# Patient Record
Sex: Female | Born: 1990 | Race: Black or African American | Hispanic: No | Marital: Single | Smoking: Never smoker
Health system: Southern US, Community
[De-identification: ages and names within clinical notes are randomized; demographics above are authoritative.]

## PROBLEM LIST (undated history)

## (undated) DIAGNOSIS — F209 Schizophrenia, unspecified: Secondary | ICD-10-CM

---

## 2016-10-24 DIAGNOSIS — F209 Schizophrenia, unspecified: Secondary | ICD-10-CM

## 2016-10-24 HISTORY — DX: Schizophrenia, unspecified: F20.9

## 2018-12-14 ENCOUNTER — Other Ambulatory Visit: Payer: Self-pay

## 2018-12-14 ENCOUNTER — Emergency Department (HOSPITAL_COMMUNITY)
Admission: EM | Admit: 2018-12-14 | Discharge: 2018-12-14 | Disposition: A | Discharge status: Home / Self Care | Payer: Medicaid - Out of State | Attending: Emergency Medicine | Admitting: Emergency Medicine

## 2018-12-14 ENCOUNTER — Encounter (HOSPITAL_COMMUNITY): Payer: Self-pay

## 2018-12-14 DIAGNOSIS — F209 Schizophrenia, unspecified: Secondary | ICD-10-CM | POA: Diagnosis not present

## 2018-12-14 DIAGNOSIS — X58XXXA Exposure to other specified factors, initial encounter: Secondary | ICD-10-CM | POA: Diagnosis not present

## 2018-12-14 DIAGNOSIS — R600 Localized edema: Secondary | ICD-10-CM

## 2018-12-14 DIAGNOSIS — Y939 Activity, unspecified: Secondary | ICD-10-CM | POA: Diagnosis not present

## 2018-12-14 DIAGNOSIS — Y999 Unspecified external cause status: Secondary | ICD-10-CM | POA: Insufficient documentation

## 2018-12-14 DIAGNOSIS — Y929 Unspecified place or not applicable: Secondary | ICD-10-CM | POA: Insufficient documentation

## 2018-12-14 DIAGNOSIS — T3390XA Superficial frostbite of unspecified sites, initial encounter: Secondary | ICD-10-CM

## 2018-12-14 DIAGNOSIS — M7989 Other specified soft tissue disorders: Secondary | ICD-10-CM | POA: Diagnosis not present

## 2018-12-14 DIAGNOSIS — T6594XA Toxic effect of unspecified substance, undetermined, initial encounter: Secondary | ICD-10-CM | POA: Insufficient documentation

## 2018-12-14 DIAGNOSIS — T24512A Corrosion of first degree of left thigh, initial encounter: Secondary | ICD-10-CM | POA: Diagnosis present

## 2018-12-14 HISTORY — DX: Schizophrenia, unspecified: F20.9

## 2018-12-14 LAB — COMPREHENSIVE METABOLIC PANEL
ALT: 51 U/L — ABNORMAL HIGH (ref 0–44)
AST: 76 U/L — ABNORMAL HIGH (ref 15–41)
Albumin: 3.9 g/dL (ref 3.5–5.0)
Alkaline Phosphatase: 57 U/L (ref 38–126)
Anion gap: 10 (ref 5–15)
BUN: 6 mg/dL (ref 6–20)
CHLORIDE: 106 mmol/L (ref 98–111)
CO2: 21 mmol/L — AB (ref 22–32)
Calcium: 8.9 mg/dL (ref 8.9–10.3)
Creatinine, Ser: 0.61 mg/dL (ref 0.44–1.00)
GFR calc Af Amer: >60 mL/min (ref >60)
GFR calc non Af Amer: >60 mL/min (ref >60)
Glucose, Bld: 94 mg/dL (ref 70–99)
Potassium: 3.6 mmol/L (ref 3.5–5.1)
Sodium: 137 mmol/L (ref 135–145)
Total Bilirubin: 0.6 mg/dL (ref 0.3–1.2)
Total Protein: 7.1 g/dL (ref 6.5–8.1)

## 2018-12-14 LAB — I-STAT BETA HCG BLOOD, ED (MC, WL, AP ONLY)

## 2018-12-14 LAB — CBC WITH DIFFERENTIAL/PLATELET
ABS IMMATURE GRANULOCYTES: 0.03 10*3/uL (ref 0.00–0.07)
Basophils Absolute: 0.1 10*3/uL (ref 0.0–0.1)
Basophils Relative: 1 %
Eosinophils Absolute: 0.2 10*3/uL (ref 0.0–0.5)
Eosinophils Relative: 2 %
HCT: 41.1 % (ref 36.0–46.0)
HEMOGLOBIN: 12.9 g/dL (ref 12.0–15.0)
IMMATURE GRANULOCYTES: 0 %
Lymphocytes Relative: 29 %
Lymphs Abs: 2.2 10*3/uL (ref 0.7–4.0)
MCH: 27.9 pg (ref 26.0–34.0)
MCHC: 31.4 g/dL (ref 30.0–36.0)
MCV: 89 fL (ref 80.0–100.0)
Monocytes Absolute: 0.6 10*3/uL (ref 0.1–1.0)
Monocytes Relative: 8 %
NEUTROS ABS: 4.4 10*3/uL (ref 1.7–7.7)
Neutrophils Relative %: 60 %
Platelets: 373 10*3/uL (ref 150–400)
RBC: 4.62 MIL/uL (ref 3.87–5.11)
RDW: 13.6 % (ref 11.5–15.5)
WBC: 7.4 10*3/uL (ref 4.0–10.5)
nRBC: 0 % (ref 0.0–0.2)

## 2018-12-14 LAB — PROTIME-INR
INR: 0.93
Prothrombin Time: 12.4 seconds (ref 11.4–15.2)

## 2018-12-14 LAB — ETHANOL: Alcohol, Ethyl (B): <10 mg/dL (ref <10)

## 2018-12-14 LAB — SEDIMENTATION RATE: Sed Rate: 26 mm/hr — ABNORMAL HIGH (ref 0–22)

## 2018-12-14 LAB — SALICYLATE LEVEL: Salicylate Lvl: <7 mg/dL (ref 2.8–30.0)

## 2018-12-14 LAB — TSH: TSH: 1.141 u[IU]/mL (ref 0.350–4.500)

## 2018-12-14 MED ORDER — BACITRACIN ZINC 500 UNIT/GM EX OINT: 1.0000 "application " | TOPICAL_OINTMENT | Freq: Two times a day (BID) | CUTANEOUS | 0 refills | Status: DC

## 2018-12-14 MED ORDER — IBUPROFEN 600 MG PO TABS: 600.0000 mg | ORAL_TABLET | Freq: Four times a day (QID) | ORAL | 0 refills | Status: DC | PRN

## 2018-12-14 MED ORDER — ACETAMINOPHEN 500 MG PO TABS
1000.0000 mg | ORAL_TABLET | Freq: Once | ORAL | Status: AC
  Administered 2018-12-14: 1000 mg via ORAL
  Filled 2018-12-14: qty 2

## 2018-12-14 NOTE — ED Notes (Signed)
Pt aware of need for urine sample.  

## 2018-12-14 NOTE — ED Notes (Addendum)
Pt refused to allow phlebotomy to draw blood. Will allow blood to be drawn, just not by that phlebotomist (there was no unprofessional behavior on the part of the phlebotomist, this was a personal request by the patient)
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
## 2018-12-14 NOTE — ED Notes (Signed)
Pt removed monitors, resting comfortably NAD, will spot check vitals.
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
## 2018-12-18 ENCOUNTER — Encounter: Payer: Self-pay | Admitting: Emergency Medicine

## 2018-12-18 ENCOUNTER — Emergency Department (HOSPITAL_COMMUNITY)
Admission: EM | Admit: 2018-12-18 | Discharge: 2018-12-18 | Disposition: A | Discharge status: Home / Self Care | Payer: Medicaid - Out of State | Attending: Emergency Medicine | Admitting: Emergency Medicine

## 2018-12-18 DIAGNOSIS — S90425A Blister (nonthermal), left lesser toe(s), initial encounter: Secondary | ICD-10-CM | POA: Insufficient documentation

## 2018-12-18 DIAGNOSIS — X31XXXA Exposure to excessive natural cold, initial encounter: Secondary | ICD-10-CM | POA: Insufficient documentation

## 2018-12-18 DIAGNOSIS — T304 Corrosion of unspecified body region, unspecified degree: Secondary | ICD-10-CM

## 2018-12-18 DIAGNOSIS — T6594XA Toxic effect of unspecified substance, undetermined, initial encounter: Secondary | ICD-10-CM | POA: Diagnosis not present

## 2018-12-18 DIAGNOSIS — T24412A Corrosion of unspecified degree of left thigh, initial encounter: Secondary | ICD-10-CM | POA: Insufficient documentation

## 2018-12-18 DIAGNOSIS — M79676 Pain in unspecified toe(s): Secondary | ICD-10-CM

## 2018-12-18 DIAGNOSIS — Y999 Unspecified external cause status: Secondary | ICD-10-CM | POA: Insufficient documentation

## 2018-12-18 DIAGNOSIS — Y939 Activity, unspecified: Secondary | ICD-10-CM | POA: Insufficient documentation

## 2018-12-18 DIAGNOSIS — Y929 Unspecified place or not applicable: Secondary | ICD-10-CM | POA: Insufficient documentation

## 2018-12-18 NOTE — Discharge Instructions (Signed)
Please follow up with your primary care provider within 5-7 days for re-evaluation of your symptoms. If you do not have a primary care provider, information for a healthcare clinic has been provided for you to make arrangements for follow up care. Please return to the emergency department for any new or worsening symptoms. ° °

## 2018-12-18 NOTE — ED Notes (Signed)
Declined W/C at D/C and was escorted to lobby by RN. 
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
## 2018-12-18 NOTE — ED Triage Notes (Addendum)
Pt presents for evaluation of tingling to hands and feet. States does not sleep well. States was here on 2/21 but was not properly discharged. States she did receive prescriptions and was told to come back. Reports condition has gone on for 2 years. Refused to elaborate on condition.

## 2018-12-18 NOTE — ED Notes (Signed)
Pt ambulatory to room with no reported issues. 

## 2018-12-27 ENCOUNTER — Ambulatory Visit (HOSPITAL_COMMUNITY)
Admission: RE | Admit: 2018-12-27 | Discharge: 2018-12-27 | Disposition: A | Discharge status: Home / Self Care | Payer: Self-pay | Attending: Psychiatry | Admitting: Psychiatry

## 2018-12-27 DIAGNOSIS — Z79899 Other long term (current) drug therapy: Secondary | ICD-10-CM | POA: Insufficient documentation

## 2018-12-27 DIAGNOSIS — Z590 Homelessness: Secondary | ICD-10-CM | POA: Insufficient documentation

## 2018-12-27 DIAGNOSIS — F329 Major depressive disorder, single episode, unspecified: Secondary | ICD-10-CM | POA: Insufficient documentation

## 2018-12-27 DIAGNOSIS — R454 Irritability and anger: Secondary | ICD-10-CM | POA: Insufficient documentation

## 2018-12-27 NOTE — BH Assessment (Signed)
Assessment Note  Virginia Buck is an 28 y.o. female.  -Patient is brought to [facility] via car by her maternal aunt.  Patient gives permission for aunt to be present during assessment.  Virginia Buck is Virginia Buck [phone].  When asked why she had come to [facility], patient says "she made me come."  Aunt said that patient has been staying with her since Monday (03/02).  Patient is currently homelss.  She has been in [place] for the past 2-3 weeks.  Patient has been living out of her car during that time.  On Monday patient reached out to her aunt about not having her car.  Patient does not know where it is or if she does, she is not telling.    Patient acts like she is not sure of what happened to her car. Aunt said that patient's behavior has been different lately.  She has been more irritable, withdrawn, talking back to grandparent, etc.  Patient shakes her head and said "no."    Patient denies any SI, HI or visual hallucinations.  She does say she hears voices "off and on" and that she cannot make out what is being said.  No command hallucinations.  Patient denies use of ETOH or illicit drugs.  Patient had a flat affect.  She denies depressive symptoms.  Patient says her anxiety is moderate.  She does say she is taking her prolixin as directed.    Pt has been to [facility] on IVC before, once in November '19 and November '18.  She has no current provider.  -Clinician discussed patient care with Donell Sievert, PA.  He said she did not meet inpatient care criteria.  Patient was given outpatient resources to follow up on.  Patient left [facility] with maternal aunt.   Diagnosis: F25.0 Schizoaffective d/o  Past Medical History:  Past Medical History:  Diagnosis Date  . Schizophrenia (HCC) 2018    No past surgical history on file.  Family History: No family history on file.  Social History:  has no history on file for tobacco, alcohol, and drug.  Additional Social History:  Alcohol / Drug  Use Pain Medications: None Prescriptions: Prolixin  Over the Counter: N/A History of alcohol / drug use?: No history of alcohol / drug abuse(Pt denies)  CIWA:   COWS:    Allergies: No Known Allergies  Home Medications: (Not in a hospital admission)   OB/GYN Status:  No LMP recorded. (Menstrual status: Irregular Periods).  General Assessment Data Location of Assessment: [facility] Assessment Services TTS Assessment: In system Is this a Tele or Face-to-Face Assessment?: Face-to-Face Is this an Initial Assessment or a Re-assessment for this encounter?: Initial Assessment Patient Accompanied by:: Adult(Virginia Buck, maternal aunt [phone]) Permission Given to speak with another: Yes Name, Relationship and Phone Number: Virginia Buck, maternal aunt [phone] Language Other than English: No Living Arrangements: Other (Comment)(Staying w/ maternal aunt) What gender do you identify as?: Female Marital status: Single Pregnancy Status: No Living Arrangements: Other relatives(with maternal aunt) Can pt return to current living arrangement?: Yes Admission Status: Voluntary Is patient capable of signing voluntary admission?: Yes Referral Source: Self/Family/Friend Insurance type: self pay  Medical Screening Exam [facility] Walk-in ONLY) Medical Exam completed: Yes(Spencer Simon, PA)  Crisis Care Plan Living Arrangements: Other relatives(with maternal aunt) Name of Psychiatrist: None Name of Therapist: None  Education Status Is patient currently in school?: No Is the patient employed, unemployed or receiving disability?: Unemployed  Risk to self with the past  6 months Suicidal Ideation: No Has patient been a risk to self within the past 6 months prior to admission? : No Suicidal Intent: No Has patient had any suicidal intent within the past 6 months prior to admission? : No Is patient at risk for suicide?: No Suicidal Plan?: No Has patient had any suicidal plan within the  past 6 months prior to admission? : No Access to Means: No What has been your use of drugs/alcohol within the last 12 months?: Pt denies Previous Attempts/Gestures: No How many times?: 0 Other Self Harm Risks: Pt denies Triggers for Past Attempts: None known Intentional Self Injurious Behavior: None Family Suicide History: No Recent stressful life event(s): Turmoil (Comment)(Pt homeless) Persecutory voices/beliefs?: No Depression: No Depression Symptoms: (Pt is denying depressive symptoms) Substance abuse history and/or treatment for substance abuse?: No Suicide prevention information given to non-admitted patients: Not applicable  Risk to Others within the past 6 months Homicidal Ideation: No Does patient have any lifetime risk of violence toward others beyond the six months prior to admission? : No Thoughts of Harm to Others: No Current Homicidal Intent: No Current Homicidal Plan: No Access to Homicidal Means: No Identified Victim: No one History of harm to others?: Yes Assessment of Violence: In past 6-12 months Violent Behavior Description: year ago Does patient have access to weapons?: No Criminal Charges Pending?: No Does patient have a court date: No Is patient on probation?: No  Psychosis Hallucinations: Auditory(Hears unintelligible voices) Delusions: None noted  Mental Status Report Appearance/Hygiene: Unremarkable Eye Contact: Fair Motor Activity: Freedom of movement, Unremarkable Speech: Logical/coherent, Soft Level of Consciousness: Alert Mood: Ambivalent Affect: Appropriate to circumstance, Blunted Anxiety Level: Moderate Thought Processes: Coherent, Relevant Judgement: Unimpaired Orientation: Person, Place, Time, Situation Obsessive Compulsive Thoughts/Behaviors: None  Cognitive Functioning Concentration: Decreased Memory: Recent Impaired, Remote Impaired Is patient IDD: No Insight: Poor Impulse Control: Poor Appetite: Good Have you had any  weight changes? : No Change Sleep: No Change Total Hours of Sleep: (Up and down at night) Vegetative Symptoms: None  ADLScreening [facility] Assessment Services) Patient's cognitive ability adequate to safely complete daily activities?: Yes Patient able to express need for assistance with ADLs?: Yes Independently performs ADLs?: Yes (appropriate for developmental age)  Prior Inpatient Therapy Prior Inpatient Therapy: Yes Prior Therapy Dates: Nov '19 & '18 Prior Therapy Facilty/Provider(s): [facility] Reason for Treatment: psychosis  Prior Outpatient Therapy Prior Outpatient Therapy: No Does patient have an ACCT team?: No Does patient have Intensive In-House Services?  : No Does patient have [facility] services? : No Does patient have P4CC services?: No  ADL Screening (condition at time of admission) Patient's cognitive ability adequate to safely complete daily activities?: Yes Is the patient deaf or have difficulty hearing?: No Does the patient have difficulty seeing, even when wearing glasses/contacts?: No Does the patient have difficulty concentrating, remembering, or making decisions?: Yes Patient able to express need for assistance with ADLs?: Yes Does the patient have difficulty dressing or bathing?: No Independently performs ADLs?: Yes (appropriate for developmental age) Does the patient have difficulty walking or climbing stairs?: No Weakness of Legs: None Weakness of Arms/Hands: None       Abuse/Neglect Assessment (Assessment to be complete while patient is alone) Abuse/Neglect Assessment Can Be Completed: Yes Physical Abuse: Denies Verbal Abuse: Denies Sexual Abuse: Denies Exploitation of patient/patient's resources: Denies Self-Neglect: Denies     Merchant navy officer (For Healthcare) Does Patient Have a Medical Advance Directive?: No Would patient like information on creating a medical advance directive?:  No - Patient declined          Disposition:   Disposition Initial Assessment Completed for this Encounter: Yes Disposition of Patient: Discharge Patient refused recommended treatment: No Mode of transportation if patient is discharged/movement?: Car Patient referred to: Other (Comment), Outpatient clinic referral(Pt given referrals)  On Site Evaluation by:   Reviewed with Physician:    Beatriz Stallion Ray 12/27/2018 11:00 PM

## 2018-12-28 NOTE — H&P (Signed)
Behavioral Health Medical Screening Exam  Virginia Buck is an 28 y.o. female wo presents to [facility] with her aunt. The patient is minimally communicative, with angry flat affect, but denies self harm, SI,SA or HI. Reportedly per previous TTS eval, when asked why she had come to [facility], patient says "she made me come."  Aunt said that patient has been staying with her since Monday (03/02).  Patient is currently homelss.  She has been in [place] for the past 2-3 weeks.  Patient has been living out of her car during that time.  On Monday patient reached out to her aunt about not having her car.  Patient does not know where it is or if she does, she is not telling.   Patient acts like she is not sure of what happened to her car. Aunt said that patient's behavior has been different lately.  She has been more irritable, withdrawn, talking back to grandparent, etc.  Patient shakes her head and said "no." Patient denies any SI, HI or visual hallucinations.  She does say she hears voices "off and on" and that she cannot make out what is being said.  No command hallucinations.Patient denies use of ETOH or illicit drugs.Patient had a flat affect.  She denies depressive symptoms.  Patient says her anxiety is moderate.  She does say she is taking her prolixin as directed.   Total Time spent with patient: 15 minutes  Psychiatric Specialty Exam: Physical Exam  Constitutional: She is oriented to person, place, and time. She appears well-developed and well-nourished. No distress.  HENT:  Head: Normocephalic.  Respiratory: Effort normal and breath sounds normal. No respiratory distress.  Neurological: She is alert and oriented to person, place, and time. No cranial nerve deficit.  Skin: Skin is warm and dry. She is not diaphoretic.  Psychiatric: Her speech is normal. Her affect is angry and labile. She is withdrawn. Cognition and memory are impaired. She expresses inappropriate judgment. She expresses no homicidal and  no suicidal ideation. She expresses no suicidal plans and no homicidal plans.    Review of Systems  Constitutional: Negative for chills, diaphoresis, fever, malaise/fatigue and weight loss.  Psychiatric/Behavioral: Positive for depression. Negative for hallucinations, substance abuse and suicidal ideas. The patient is not nervous/anxious and does not have insomnia.     There were no vitals taken for this visit.There is no height or weight on file to calculate BMI.  General Appearance: Disheveled  Eye Contact:  Poor  Speech:  Clear and Coherent  Volume:  Normal  Mood:  Angry  Affect:  Congruent  Thought Process:  Disorganized  Orientation:  Full (Time, Place, and Person)  Thought Content:  Tangential  Suicidal Thoughts:  No  Homicidal Thoughts:  No  Memory:  Immediate;   Fair  Judgement:  Poor  Insight:  Lacking  Psychomotor Activity:  Normal  Concentration: Concentration: Fair  Recall:  Poor  Fund of Knowledge:Poor  Language: Fair  Akathisia:  Negative  Handed:  Right  AIMS (if indicated):     Assets:  Social Support  Sleep:       Musculoskeletal: Strength & Muscle Tone: within normal limits Gait & Station: normal Patient leans: N/A  There were no vitals taken for this visit.  Recommendations:  Based on my evaluation the patient does not appear to have an emergency medical condition.  Kerry Hough, PA-C 12/28/2018, 6:27 AM

## 2019-01-11 ENCOUNTER — Emergency Department (HOSPITAL_COMMUNITY)
Admission: EM | Admit: 2019-01-11 | Discharge: 2019-01-11 | Disposition: A | Discharge status: Home / Self Care | Payer: Medicaid - Out of State

## 2019-01-11 NOTE — ED Triage Notes (Addendum)
RN attempted to start triage. Asked pt one question, she began asking about when the vascular center would open. Pt stated that she didn't want RN or NT to provide care for her anymore. Pt stated "I don't want to fucking be here." Pt began dressing and getting read to leave. Pt escorted off premises by security.

## 2019-01-14 ENCOUNTER — Other Ambulatory Visit: Payer: Self-pay

## 2019-01-14 ENCOUNTER — Emergency Department (HOSPITAL_COMMUNITY): Payer: Medicaid - Out of State

## 2019-01-14 ENCOUNTER — Emergency Department (HOSPITAL_COMMUNITY)
Admission: EM | Admit: 2019-01-14 | Discharge: 2019-01-14 | Disposition: A | Discharge status: Home / Self Care | Payer: Medicaid - Out of State | Attending: Emergency Medicine | Admitting: Emergency Medicine

## 2019-01-14 ENCOUNTER — Encounter (HOSPITAL_COMMUNITY): Payer: Self-pay | Admitting: Emergency Medicine

## 2019-01-14 DIAGNOSIS — F209 Schizophrenia, unspecified: Secondary | ICD-10-CM | POA: Insufficient documentation

## 2019-01-14 DIAGNOSIS — N939 Abnormal uterine and vaginal bleeding, unspecified: Secondary | ICD-10-CM | POA: Diagnosis present

## 2019-01-14 DIAGNOSIS — M25562 Pain in left knee: Secondary | ICD-10-CM | POA: Insufficient documentation

## 2019-01-14 DIAGNOSIS — R6889 Other general symptoms and signs: Secondary | ICD-10-CM

## 2019-01-14 LAB — COMPREHENSIVE METABOLIC PANEL
ALT: 30 U/L (ref 0–44)
AST: 39 U/L (ref 15–41)
Albumin: 3.6 g/dL (ref 3.5–5.0)
Alkaline Phosphatase: 55 U/L (ref 38–126)
Anion gap: 11 (ref 5–15)
BUN: 14 mg/dL (ref 6–20)
CO2: 24 mmol/L (ref 22–32)
Calcium: 9 mg/dL (ref 8.9–10.3)
Chloride: 102 mmol/L (ref 98–111)
Creatinine, Ser: 0.71 mg/dL (ref 0.44–1.00)
GFR calc Af Amer: >60 mL/min (ref >60)
GFR calc non Af Amer: >60 mL/min (ref >60)
Glucose, Bld: 97 mg/dL (ref 70–99)
Potassium: 3.2 mmol/L — ABNORMAL LOW (ref 3.5–5.1)
Sodium: 137 mmol/L (ref 135–145)
TOTAL PROTEIN: 6.6 g/dL (ref 6.5–8.1)
Total Bilirubin: 0.8 mg/dL (ref 0.3–1.2)

## 2019-01-14 LAB — CBC WITH DIFFERENTIAL/PLATELET
Abs Immature Granulocytes: 0.02 10*3/uL (ref 0.00–0.07)
BASOS ABS: 0.1 10*3/uL (ref 0.0–0.1)
Basophils Relative: 1 %
Eosinophils Absolute: 0.1 10*3/uL (ref 0.0–0.5)
Eosinophils Relative: 2 %
HCT: 37.5 % (ref 36.0–46.0)
Hemoglobin: 12.1 g/dL (ref 12.0–15.0)
IMMATURE GRANULOCYTES: 0 %
Lymphocytes Relative: 40 %
Lymphs Abs: 2.1 10*3/uL (ref 0.7–4.0)
MCH: 27.9 pg (ref 26.0–34.0)
MCHC: 32.3 g/dL (ref 30.0–36.0)
MCV: 86.6 fL (ref 80.0–100.0)
Monocytes Absolute: 0.5 10*3/uL (ref 0.1–1.0)
Monocytes Relative: 9 %
NRBC: 0 % (ref 0.0–0.2)
Neutro Abs: 2.5 10*3/uL (ref 1.7–7.7)
Neutrophils Relative %: 48 %
Platelets: 359 10*3/uL (ref 150–400)
RBC: 4.33 MIL/uL (ref 3.87–5.11)
RDW: 13 % (ref 11.5–15.5)
WBC: 5.3 10*3/uL (ref 4.0–10.5)

## 2019-01-14 LAB — I-STAT BETA HCG BLOOD, ED (MC, WL, AP ONLY): I-stat hCG, quantitative: <5 m[IU]/mL (ref <5)

## 2019-01-14 MED ORDER — ACETAMINOPHEN 325 MG PO TABS
650.0000 mg | ORAL_TABLET | Freq: Once | ORAL | Status: AC
  Administered 2019-01-14: 650 mg via ORAL
  Filled 2019-01-14: qty 2

## 2019-01-14 NOTE — ED Notes (Addendum)
Pt reports feet pain x 2 weeks, L knee pain started yesterday, and constant vaginal bleeding that began 2 weeks ago and ended a week ago. Additional complaints of dehydration.  Her feet and knee hurts when she walks.

## 2019-01-14 NOTE — ED Provider Notes (Signed)
[facility] EMERGENCY DEPARTMENT Provider Note   CSN: [id number] Arrival date & time: 01/14/19  1733    History   Chief Complaint Chief Complaint  Patient presents with  . Vaginal Bleeding    HPI Virginia Buck is a 28 y.o. female.     Patient is a 28 year old female with past medical history of schizophrenia who presents the emergency department for multiple complaints.  When I initially entered the room and asked the patient what brought her to the emergency department, she looks at the nurse and says "asks her, I do not feel like repeating myself".  She reports that she has had vaginal bleeding for about 2 weeks.  Upon further questioning she states that it has been off and on and not heavy and that she actually has not bled for at least a week.  She is a very poor historian.  She has a very flat affect.  When I asked her if she is sexually active she states "do not ask me that".  I explained to the patient the importance of the question and she states that she is not sexually active.  She then reports that a few weeks ago she felt like she had a lump in her stomach like she was pregnant.  She denies any vaginal discharge or vaginal pain or nausea or vomiting.  She also reports that she feels dehydrated and that her feet hurt.  Reports that she has had left knee pain off and on.  Atraumatic.  Mostly anterior and aching.  Patient reports that she has been trying to get help but has not been helped by anyone.  It appears that she left without being seen from the emergency department several days ago.  She is currently seeing behavioral health. She denies SI, HI or anxiety at this moment     Past Medical History:  Diagnosis Date  . Schizophrenia (HCC) 2018    There are no active problems to display for this patient.   History reviewed. No pertinent surgical history.   OB History   No obstetric history on file.      Home Medications    Prior to Admission  medications   Medication Sig Start Date End Date Taking? Authorizing Provider  bacitracin ointment Apply 1 application topically 2 (two) times daily. 12/14/18   Arby Barrette, MD  ibuprofen (ADVIL,MOTRIN) 600 MG tablet Take 1 tablet (600 mg total) by mouth every 6 (six) hours as needed. 12/14/18   Arby Barrette, MD    Family History No family history on file.  Social History Social History   Tobacco Use  . Smoking status: Never Smoker  . Smokeless tobacco: Never Used  Substance Use Topics  . Alcohol use: Yes  . Drug use: Never     Allergies   Patient has no known allergies.   Review of Systems Review of Systems  Constitutional: Positive for fatigue. Negative for chills and fever.  HENT: Negative for congestion, ear pain and sore throat.   Eyes: Negative for pain and visual disturbance.  Respiratory: Negative for cough, choking and shortness of breath.   Cardiovascular: Negative for chest pain and palpitations.  Gastrointestinal: Positive for abdominal pain. Negative for nausea and vomiting.  Genitourinary: Positive for vaginal bleeding. Negative for dysuria, flank pain, hematuria, pelvic pain, vaginal discharge and vaginal pain.  Musculoskeletal: Positive for arthralgias. Negative for back pain, gait problem and joint swelling.  Skin: Negative for color change and rash.  Allergic/Immunologic: Negative for  immunocompromised state.  Neurological: Negative for dizziness, seizures and syncope.  All other systems reviewed and are negative.    Physical Exam Updated Vital Signs BP (!) 109/54   Pulse 97   Temp (!) 97.4 F (36.3 C) (Oral)   Resp 16   LMP 11/15/2018 Comment: shielded patient  SpO2 93%   Physical Exam Vitals signs and nursing note reviewed.  Constitutional:      Appearance: Normal appearance.  HENT:     Head: Normocephalic.     Nose: Nose normal. No congestion or rhinorrhea.     Mouth/Throat:     Mouth: Mucous membranes are moist.  Eyes:      Conjunctiva/sclera: Conjunctivae normal.  Cardiovascular:     Rate and Rhythm: Normal rate and regular rhythm.  Pulmonary:     Effort: Pulmonary effort is normal.  Abdominal:     General: Abdomen is flat. Bowel sounds are normal. There is no distension.     Palpations: There is no mass.     Tenderness: There is no abdominal tenderness. There is no guarding.  Musculoskeletal: Normal range of motion.        General: No swelling, tenderness, deformity or signs of injury.     Right lower leg: No edema.     Left lower leg: No edema.  Skin:    General: Skin is dry.     Capillary Refill: Capillary refill takes less than 2 seconds.  Neurological:     General: No focal deficit present.     Mental Status: She is alert.  Psychiatric:        Mood and Affect: Mood normal.      ED Treatments / Results  Labs (all labs ordered are listed, but only abnormal results are displayed) Labs Reviewed  COMPREHENSIVE METABOLIC PANEL - Abnormal; Notable for the following components:      Result Value   Potassium 3.2 (*)    All other components within normal limits  CBC WITH DIFFERENTIAL/PLATELET  URINALYSIS, ROUTINE W REFLEX MICROSCOPIC  I-STAT BETA HCG BLOOD, ED ([facility], [facility], [facility] ONLY)    EKG None  Radiology Dg Knee 2 Views Left  Result Date: 01/14/2019 CLINICAL DATA:  Lateral knee pain since yesterday. EXAM: LEFT KNEE - 1-2 VIEW COMPARISON:  None. FINDINGS: No evidence of fracture, dislocation, or joint effusion. No evidence of arthropathy or other focal bone abnormality. Soft tissues are unremarkable. IMPRESSION: No acute nor suspicious osseous appearing abnormality. Electronically Signed   By: Tollie Eth M.D.   On: 01/14/2019 19:31    Procedures Procedures (including critical care time)  Medications Ordered in ED Medications  acetaminophen (TYLENOL) tablet 650 mg (650 mg Oral Given 01/14/19 1910)     Initial Impression / Assessment and Plan / ED Course  I have reviewed the triage vital  signs and the nursing notes.  Pertinent labs & imaging results that were available during my care of the patient were reviewed by me and considered in my medical decision making (see chart for details).  Clinical Course as of Jan 14 2051  Mon Jan 14, 2019  1929 Patient refused pelvic exam and declined STD testing. I explained the benefit of both but she still declined and stated not currently having vaginal bleeding and is not sexually active.    [KM]  2046 When I went to reassess the patient she was sleeping comfortably on the stretcher.  She was easily awakened.  She states that I am not her nurse and she wants me  to leave.  I gave her her results and stated that everything looked normal and I would be giving her the information for a primary care doctor to follow-up with tomorrow.  She again told me that I was not her nurse and that I should leave.   [KM]    Clinical Course User Index [KM] Arlyn DunningMcLean, Marchelle Rinella A, PA-C        Final Clinical Impressions(s) / ED Diagnoses   Final diagnoses:  Multiple somatic complaints  Vaginal bleeding  Acute pain of left knee    ED Discharge Orders    None       Jeral PinchMcLean, Ginevra Tacker A, PA-C 01/14/19 2053    Tegeler, Canary Brimhristopher J, MD 01/14/19 2352

## 2019-01-14 NOTE — ED Notes (Signed)
Pt escorted out of the ED by security.

## 2019-01-14 NOTE — ED Notes (Signed)
Patient transported to x-ray. ?

## 2019-01-14 NOTE — Discharge Instructions (Signed)
Thank you for allowing me to care for you today. Please return to the emergency department if you have new or worsening symptoms. Take your medications as instructed.  ° °

## 2019-01-14 NOTE — ED Triage Notes (Signed)
Pt c/o left knee pain, denies injury/trauma, states "I have been out in the cold a lot". Also c/o vaginal bleeding x 2-3 weeks.

## 2019-06-18 ENCOUNTER — Other Ambulatory Visit: Payer: Self-pay

## 2019-06-18 ENCOUNTER — Emergency Department (HOSPITAL_COMMUNITY)
Admission: EM | Admit: 2019-06-18 | Discharge: 2019-06-18 | Disposition: A | Discharge status: Home / Self Care | Payer: Medicaid - Out of State | Attending: Emergency Medicine | Admitting: Emergency Medicine

## 2019-06-18 DIAGNOSIS — F209 Schizophrenia, unspecified: Secondary | ICD-10-CM | POA: Diagnosis not present

## 2019-06-18 DIAGNOSIS — Z760 Encounter for issue of repeat prescription: Secondary | ICD-10-CM | POA: Insufficient documentation

## 2019-06-18 DIAGNOSIS — Z9114 Patient's other noncompliance with medication regimen: Secondary | ICD-10-CM | POA: Insufficient documentation

## 2019-06-18 DIAGNOSIS — R440 Auditory hallucinations: Secondary | ICD-10-CM | POA: Diagnosis present

## 2019-06-18 MED ORDER — FLUPHENAZINE HCL 5 MG PO TABS: 5.0000 mg | ORAL_TABLET | Freq: Every day | ORAL | 0 refills | Status: AC

## 2019-06-18 NOTE — Discharge Instructions (Signed)
Thank you for allowing me to care for you today in the Emergency Department.   You will receive a call from 1 of the case managers in the ER at the number you provided [place] sometime before tomorrow afternoon.  You have been given a paper prescription of Prolixin.  Please hold onto this prescription.  When you receive a call from the case manager, they will let you know if and what pharmacy may be able to fill the prescription at no cost.  Sometimes case managers cannot find a pharmacy that are willing to fill the prescription for no cost.  If that occurs, hold onto the paper prescription and download the app or go to the website for good Rx to download the coupon.  The good Rx coupon is for around $75.  I have attached a referral for [facility] or you can follow-up with behavioral health to get established with a psychiatrist or therapist.  Please make sure to call and schedule a follow-up appointment to make sure that you are able to get your next months prescription as I am unable to give you a refill.  Return to the emergency department if you have thoughts of wanting to harm or kill yourself or others, if you start to hear voices that instruct you to harm yourself or others, or other new, concerning symptoms.

## 2019-06-18 NOTE — ED Triage Notes (Signed)
Patient requesting prescription for her Prolixin 5mg  q HS . She has not taken her medication for 3 weeks . Denies SI , occasional hallucinations , history of schizophrenia.

## 2019-06-18 NOTE — ED Provider Notes (Signed)
[name]   CSN: [id number] Arrival date & time: 06/18/19  2055     History   Chief Complaint Chief Complaint  Patient presents with   Medication Refill    HPI Virginia Buck is a 28 y.o. female with a history of schizophrenia who presents to the emergency department with a chief complaint of medication refill.  The patient reports that she was recently discharged from an inpatient behavioral health admission.  She reports that she ran out of her home fluphenazine prescription approximately 3 weeks ago.  She reports that she has been on the 5 mg dose of the medication for the last year.    She reports that since she has been out of the medication that she has been having some auditory and visual hallucinations since she ran out of the medication.  She reports that she has been seeing dark spots out of the corner of her eye.  She has been hearing voices, but the voices have not been angry, violent, or instructing her to harm herself or others.  She denies SI or HI.  She reports that she is otherwise been feeling well since discharge other than the fact that she is out of the medication.  She reports that [place] just [place] the [place]  She has already applied for [facility], but the application has not been processed yet.  She is also looking to get established with a mental health professional in the area.  She denies fever, chills, worsening depressed mood, insomnia, erratic behavior, paranoia, or delusional ideas.  No shortness of breath, chest pain, nausea, vomiting, or diarrhea.     The history is provided by the patient. No language interpreter was used.    Past Medical History:  Diagnosis Date   Schizophrenia ([place]) 2018    There are no active problems to display for this patient.   No past surgical history on file.   OB History   No obstetric history on file.      Home  Medications    Prior to Admission medications   Medication Sig Start Date End Date Taking? Authorizing Provider  fluPHENAZine (PROLIXIN) 5 MG tablet Take 1 tablet (5 mg total) by mouth daily. 06/18/19   Xue Low A, PA-C    Family History No family history on file.  Social History Social History   Tobacco Use   Smoking status: Never Smoker   Smokeless tobacco: Never Used  Substance Use Topics   Alcohol use: Yes   Drug use: Never     Allergies   Patient has no known allergies.   Review of Systems Review of Systems  Constitutional: Negative for activity change, chills and fever.  Respiratory: Negative for shortness of breath.   Cardiovascular: Negative for chest pain.  Gastrointestinal: Negative for abdominal pain, blood in stool, nausea and vomiting.  Genitourinary: Negative for dysuria.  Musculoskeletal: Negative for back pain.  Skin: Negative for rash.  Allergic/Immunologic: Negative for immunocompromised state.  Neurological: Negative for headaches.  Psychiatric/Behavioral: Positive for hallucinations. Negative for agitation, confusion, decreased concentration, dysphoric mood, self-injury and suicidal ideas. The patient is not nervous/anxious.      Physical Exam Updated Vital Signs BP 115/71 (BP Location: Right Arm)    Pulse 81    Temp 98.6 F (37 C) (Oral)    Resp 16    SpO2 99%   Physical Exam Vitals signs and nursing note reviewed.  Constitutional:      General: She is not in acute distress.    Appearance: She is not ill-appearing, toxic-appearing or diaphoretic.     Comments: Well groomed.  HENT:     Head: Normocephalic.  Eyes:     Conjunctiva/sclera: Conjunctivae normal.  Neck:     Musculoskeletal: Neck supple.  Cardiovascular:     Rate and Rhythm: Normal rate and regular rhythm.     Heart sounds: No murmur. No friction rub. No gallop.   Pulmonary:     Effort: Pulmonary effort is normal. No respiratory distress.  Abdominal:     General:  There is no distension.     Palpations: Abdomen is soft.  Skin:    General: Skin is warm.     Findings: No rash.  Neurological:     Mental Status: She is alert.  Psychiatric:        Attention and Perception: Attention normal. She is attentive.        Mood and Affect: Affect normal.        Speech: Speech normal. Speech is not rapid and pressured or tangential.        Behavior: Behavior is not agitated, aggressive or hyperactive. Behavior is cooperative.        Thought Content: Thought content normal. Thought content is not paranoid or delusional. Thought content does not include homicidal or suicidal ideation. Thought content does not include homicidal or suicidal plan.     Comments: Answers questions appropriately.  Cooperative.  Does not appear to be responding to internal stimuli.       ED Treatments / Results  Labs (all labs ordered are listed, but only abnormal results are displayed) Labs Reviewed - No data to display  EKG None  Radiology No results found.  Procedures Procedures (including critical care time)  Medications Ordered in ED Medications - No data to display   Initial Impression / Assessment and Plan / ED Course  I have reviewed the triage vital signs and the nursing notes.  Pertinent labs & imaging results that were available during my care of the patient were reviewed by me and considered in my medical decision making (see chart for details).        28 year old female with a history of schizophrenia who is presenting for medication refill.  She was discharged from an inpatient behavioral health facility a few weeks ago and has been out of her home 5 mg of fluphenazine for the last 3 weeks.  She is having some auditory visual hallucinations, but no SI or HI.  No kill commands.  Voices have not been angry, violent, or instructing her to hurt herself or others.  At this time, I do not feel that she meets inpatient behavioral health admission.  I think she  would most benefit from resuming her home medications and following up in the outpatient setting.  She was requesting a referral for behavioral health services in the community, which has been given.  Spoke with Sophire, Child psychotherapistsocial worker, as the patient's medication is quite expensive since she does not have inpatient Medicaid.  She recommended to place case management consult and have the a.m. case manager call the patient in the morning.  I had a shared decision-making conversation with the patient and did offer TTS consult, which the patient declined.  I think this is reasonable.  She is agreeable to waiting until she receives a call from case management in the morning.  She has been given a 30-day  supply on a paper prescription and was instructed to hold onto the prescription until she received a call from case management informing her of which pharmacy will be able to fill the prescription.  I have also advised her that it is possible that a pharmacy may not be willing to utilize the match program for this prescription.  If so, she was given resources for good Rx, but the medication is still quite expensive.  She was also given strict return precautions to the emergency department if she were to develop SI, HI, or worsening hallucinations.  She is hemodynamically stable and in no acute distress.  Safe for discharge to home with outpatient follow-up at this time.  Final Clinical Impressions(s) / ED Diagnoses   Final diagnoses:  Medication refill    ED Discharge Orders         Ordered    fluPHENAZine (PROLIXIN) 5 MG tablet  Daily     06/18/19 2305           Frederik PearMcDonald, Jenell Dobransky A, PA-C 06/18/19 2323    Gerhard MunchLockwood, Robert, MD 06/18/19 2351

## 2019-06-18 NOTE — ED Notes (Signed)
Patient verbalizes understanding of discharge instructions. Opportunity for questioning and answers were provided. Armband removed by staff, pt discharged from ED.  

## 2019-06-19 ENCOUNTER — Telehealth: Payer: Self-pay | Admitting: *Deleted

## 2019-06-19 ENCOUNTER — Encounter: Payer: Self-pay | Admitting: *Deleted

## 2019-06-19 NOTE — Telephone Encounter (Signed)
ED CM consulted by EDP for medication assistance. EDCM reviewed chart and spoke with the pt about [facility] MATCH program ($3 co pay for each Rx through [facility] program, does not include refills, 7 day expiration of [place] letter and choice of pharmacies). Pt is eligible for [facility] MATCH program (unable to find pt listed in [employer] per cardholder name inquiry) and has agreed to accept [facility] under terms discussed. [employer] information entered. [place] letter completed and provided to pt.  EDCM updated EDP and ED RN.   EDCM also confirmed that pt does not have PCP. NCM discussed and provided an appointment 9/9 @2 :10 at [facility].

## 2019-06-19 NOTE — Telephone Encounter (Signed)
TOC CM received call from patient inquiring about her RX and [place]. Contacted [facility] and they do have Rx, requested TOC CM fax [employer] letter again to pharmacy. They will order medication and have in stock tomorrow or in two days. Notified patient. [place], [place] ED TOC CM [phone]

## 2019-07-03 ENCOUNTER — Inpatient Hospital Stay (INDEPENDENT_AMBULATORY_CARE_PROVIDER_SITE_OTHER): Payer: Medicaid - Out of State | Admitting: Primary Care

## 2020-05-01 IMAGING — DX LEFT KNEE - 1-2 VIEW
2 series · 2 of 2 positions shown · non-contrast
Comparison: None.

CLINICAL DATA: Lateral knee pain since yesterday.

EXAM:
LEFT KNEE - 1-2 VIEW

[knee ap]
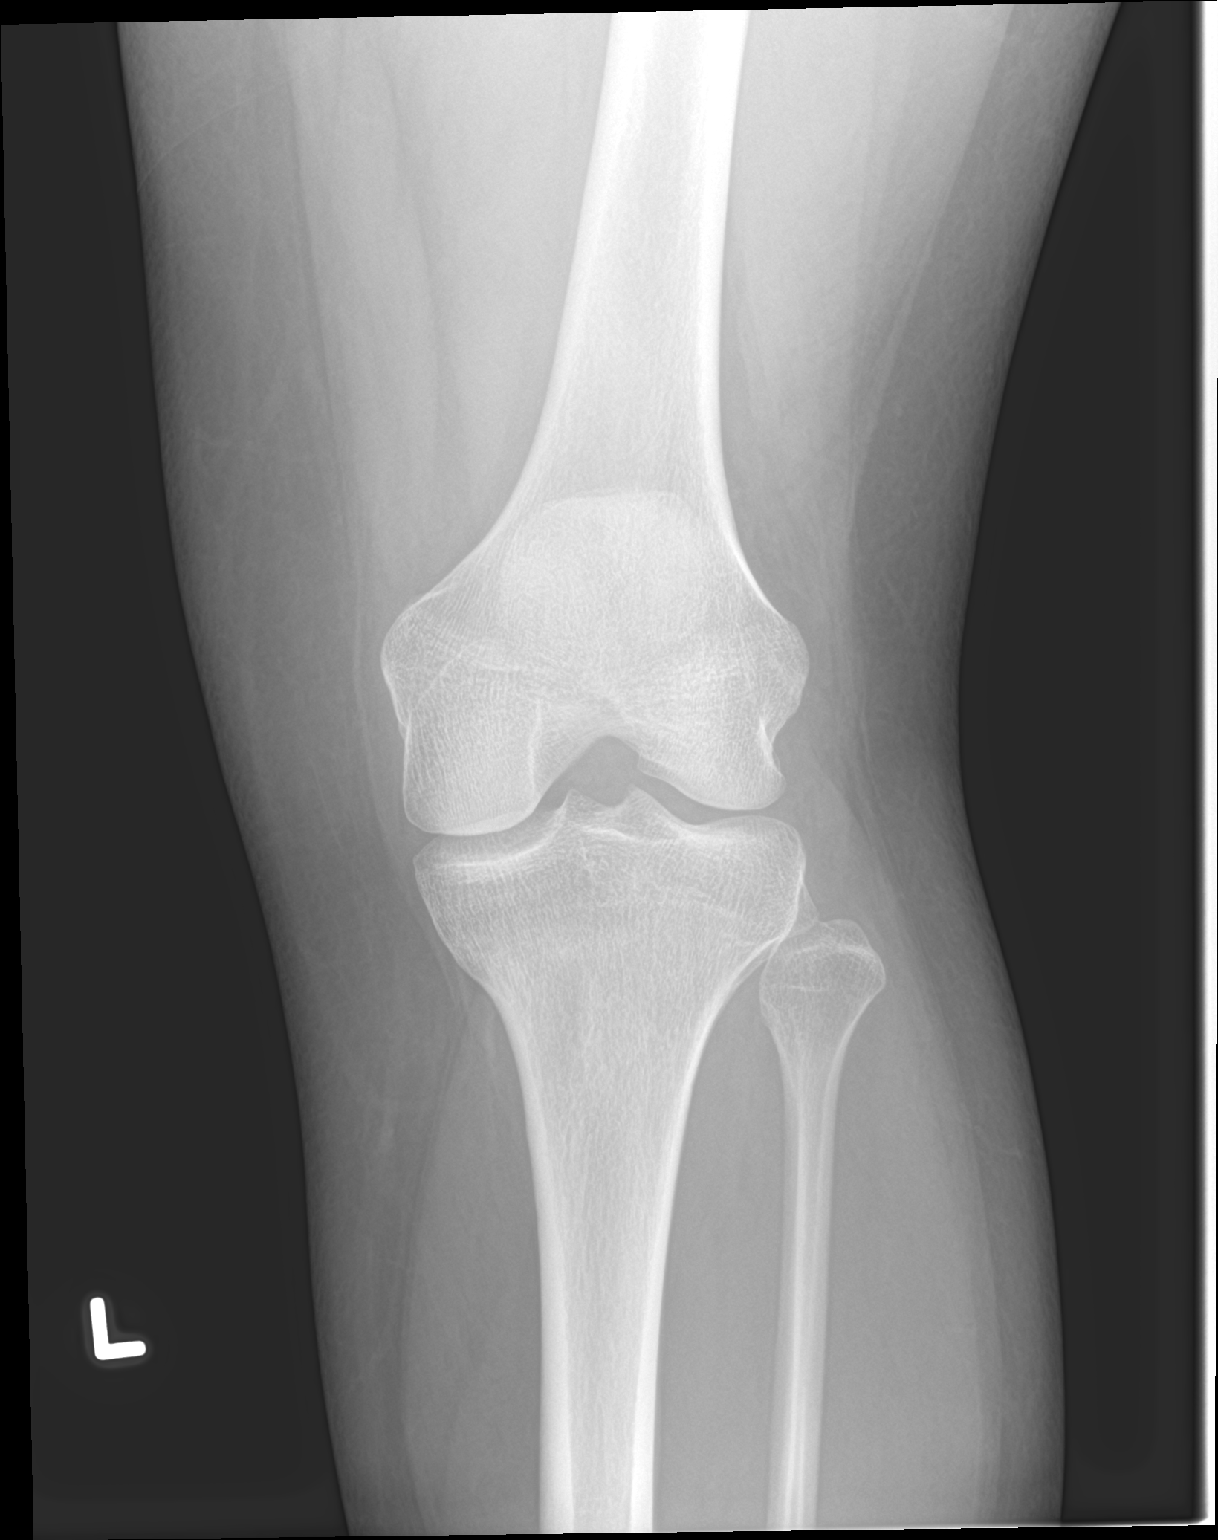

[knee lat]
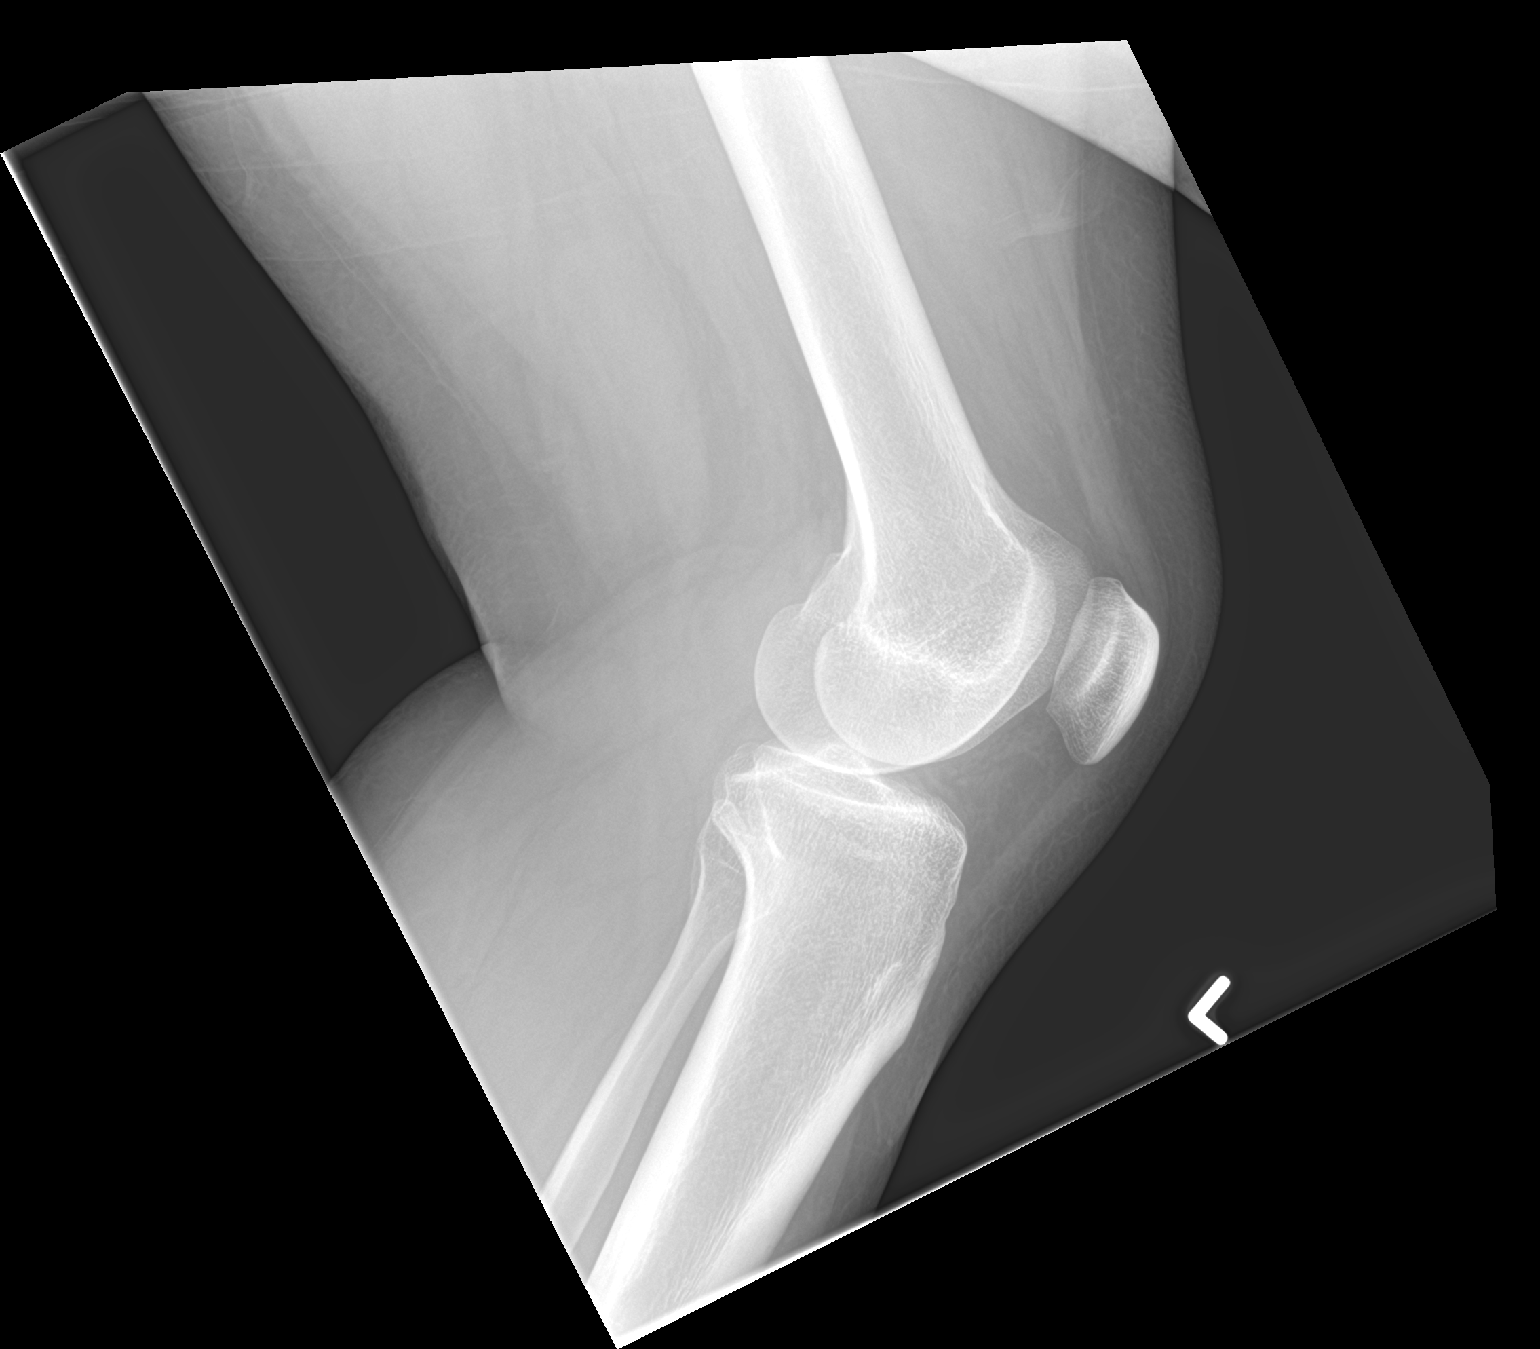

[2 of 2 positions shown; findings below may reference images not displayed]

FINDINGS: No evidence of fracture, dislocation, or joint effusion. No evidence
of arthropathy or other focal bone abnormality. Soft tissues are
unremarkable.
IMPRESSION: No acute nor suspicious osseous appearing abnormality.
